# Patient Record
Sex: Male | Born: 1970 | Race: White | Hispanic: No | Marital: Married | Smoking: Former smoker
Health system: Southern US, Community
[De-identification: ages and names within clinical notes are randomized; demographics above are authoritative.]

## PROBLEM LIST (undated history)

## (undated) DIAGNOSIS — N289 Disorder of kidney and ureter, unspecified: Secondary | ICD-10-CM

## (undated) HISTORY — PX: HERNIA REPAIR: SHX51

---

## 2006-02-13 ENCOUNTER — Emergency Department (HOSPITAL_COMMUNITY): Admission: EM | Admit: 2006-02-13 | Discharge: 2006-02-13 | Payer: Self-pay | Admitting: Emergency Medicine

## 2006-02-15 ENCOUNTER — Ambulatory Visit (HOSPITAL_COMMUNITY): Admission: AD | Admit: 2006-02-15 | Discharge: 2006-02-15 | Payer: Self-pay | Admitting: Urology

## 2007-03-09 ENCOUNTER — Emergency Department (HOSPITAL_COMMUNITY): Admission: EM | Admit: 2007-03-09 | Discharge: 2007-03-09 | Payer: Self-pay | Admitting: Emergency Medicine

## 2007-06-11 ENCOUNTER — Emergency Department (HOSPITAL_COMMUNITY): Admission: EM | Admit: 2007-06-11 | Discharge: 2007-06-11 | Payer: Self-pay | Admitting: Emergency Medicine

## 2010-09-03 NOTE — Op Note (Signed)
NAMEAADIN, GAUT NO.:  [id number]   MEDICAL RECORD NO.:  [id number]          PATIENT TYPE:  AMB   LOCATION:  DAY                          FACILITY:  [facility]   PHYSICIAN:  Maretta Bees. Vonita Moss, M.D.DATE OF BIRTH:  Nov 08, 1970   DATE OF PROCEDURE:  02/15/2006  DATE OF DISCHARGE:                                 OPERATIVE REPORT   PREOPERATIVE DIAGNOSIS:  Distal left ureteral stone.   POSTOPERATIVE DIAGNOSIS:  Distal left ureteral stone.   PROCEDURE:  Cystoscopy, left ureteroscopy and left ureteroscopic stone  basketing.   SURGEON:  Dr. Larey Dresser.   ANESTHESIA:  General.   INDICATIONS:  This 40 year old gentleman is having severe left flank pain,  nausea and discomfort and has missed work for the last 3 days due to pain  from a 3-4 mm stone in the distal left ureter diagnosed on CT scan at [facility] on 02/13/2006 I saw him in the office today.  We discussed  observation versus intervention.  He fell like he needs something done  because of the persistent pain and missing work.   PROCEDURE:  The patient was brought to the operating room, placed in  lithotomy position.  External genitalia were prepped and draped in usual  fashion.  He was cystoscoped and the bladder was unremarkable.  The Sensor  guidewire was placed up the left ureter without difficulty.  Ureteral  orifice was not large enough to accommodate the 6-French rigid scope without  dilation, so I inserted the inner core of a ureteral access dilating sheath  and dilated the intramural ureter.  I then utilized a 6-French ureteroscope  and using the nitinol stone basket, retrieved the stone.  There was one  large fragment I retrieved and there was a couple of other smaller fragments  of stone that broke apart from either the dilating sheath or closing the  basket.  In any case, I then reinserted the cystoscope.  He did have some  edema and swelling, but I saw no residual stone fragments.   With the  guidewire in place, I reinserted the inner core of the dilating access  sheath and injected contrast retrograde and there was no extravasation in  the distal ureter and prompt drainage of contrast into the bladder.  Therefore I did not feel like he needed a double-J catheter and the patient  actually desired not to have one placed.  At this point he is taken to  recovery room in good condition having tolerated the procedure well.  The  stone was given to his wife.      Maretta Bees. Vonita Moss, M.D.  Electronically Signed     LJP/MEDQ  D:  02/15/2006  T:  02/16/2006  Job:  [id number]

## 2011-01-07 LAB — POCT CARDIAC MARKERS
CKMB, poc: <1 — ABNORMAL LOW
CKMB, poc: <1 — ABNORMAL LOW
Myoglobin, poc: 11.9 — ABNORMAL LOW
Myoglobin, poc: 7.5 — ABNORMAL LOW

## 2011-01-25 LAB — URINALYSIS, ROUTINE W REFLEX MICROSCOPIC
Glucose, UA: NEGATIVE
Protein, ur: NEGATIVE
pH: 5.5

## 2011-01-25 LAB — URINE CULTURE
Colony Count: NO GROWTH
Culture: NO GROWTH

## 2011-01-25 LAB — URINE MICROSCOPIC-ADD ON

## 2011-01-25 LAB — BASIC METABOLIC PANEL WITH GFR
BUN: 16
Chloride: 107
Creatinine, Ser: 0.93
Glucose, Bld: 111 — ABNORMAL HIGH
Potassium: 3.8

## 2011-01-25 LAB — BASIC METABOLIC PANEL
CO2: 27
Calcium: 9.3
GFR calc Af Amer: >60
GFR calc non Af Amer: >60
Sodium: 140

## 2013-12-30 ENCOUNTER — Emergency Department (HOSPITAL_COMMUNITY)
Admission: EM | Admit: 2013-12-30 | Discharge: 2013-12-30 | Disposition: A | Discharge status: Home / Self Care | Payer: 59 | Attending: Emergency Medicine | Admitting: Emergency Medicine

## 2013-12-30 ENCOUNTER — Encounter (HOSPITAL_COMMUNITY): Payer: Self-pay | Admitting: Emergency Medicine

## 2013-12-30 ENCOUNTER — Emergency Department (HOSPITAL_COMMUNITY): Payer: 59

## 2013-12-30 DIAGNOSIS — R109 Unspecified abdominal pain: Secondary | ICD-10-CM | POA: Diagnosis present

## 2013-12-30 DIAGNOSIS — F411 Generalized anxiety disorder: Secondary | ICD-10-CM | POA: Diagnosis not present

## 2013-12-30 DIAGNOSIS — Z87891 Personal history of nicotine dependence: Secondary | ICD-10-CM | POA: Insufficient documentation

## 2013-12-30 DIAGNOSIS — N23 Unspecified renal colic: Secondary | ICD-10-CM | POA: Insufficient documentation

## 2013-12-30 HISTORY — DX: Disorder of kidney and ureter, unspecified: N28.9

## 2013-12-30 LAB — URINALYSIS, ROUTINE W REFLEX MICROSCOPIC
Bilirubin Urine: NEGATIVE
Glucose, UA: NEGATIVE mg/dL
Ketones, ur: NEGATIVE mg/dL
LEUKOCYTES UA: NEGATIVE
NITRITE: NEGATIVE
PH: 6 (ref 5.0–8.0)
Protein, ur: NEGATIVE mg/dL
SPECIFIC GRAVITY, URINE: 1.022 (ref 1.005–1.030)
UROBILINOGEN UA: 0.2 mg/dL (ref 0.0–1.0)

## 2013-12-30 LAB — URINE MICROSCOPIC-ADD ON

## 2013-12-30 MED ORDER — OXYCODONE-ACETAMINOPHEN 7.5-325 MG PO TABS
1.0000 | ORAL_TABLET | ORAL | Status: DC | PRN
Start: 2013-12-30 — End: 2014-01-02

## 2013-12-30 MED ORDER — HYDROMORPHONE HCL PF 1 MG/ML IJ SOLN
1.0000 mg | Freq: Once | INTRAMUSCULAR | Status: AC
  Administered 2013-12-30: 1 mg via INTRAVENOUS
  Filled 2013-12-30: qty 1

## 2013-12-30 MED ORDER — HYDROMORPHONE HCL PF 1 MG/ML IJ SOLN
2.0000 mg | Freq: Once | INTRAMUSCULAR | Status: AC
  Administered 2013-12-30: 2 mg via INTRAVENOUS
  Filled 2013-12-30: qty 2

## 2013-12-30 MED ORDER — KETOROLAC TROMETHAMINE 30 MG/ML IJ SOLN
30.0000 mg | Freq: Once | INTRAMUSCULAR | Status: AC
  Administered 2013-12-30: 30 mg via INTRAVENOUS
  Filled 2013-12-30: qty 1

## 2013-12-30 MED ORDER — TAMSULOSIN HCL 0.4 MG PO CAPS: 0.4000 mg | ORAL_CAPSULE | Freq: Every day | ORAL | Status: DC

## 2013-12-30 MED ORDER — ONDANSETRON 8 MG PO TBDP: 8.0000 mg | ORAL_TABLET | Freq: Three times a day (TID) | ORAL | Status: DC | PRN

## 2013-12-30 MED ORDER — SODIUM CHLORIDE 0.9 % IV SOLN
INTRAVENOUS | Status: DC
  Administered 2013-12-30: 11:00:00 via INTRAVENOUS

## 2013-12-30 MED ORDER — ONDANSETRON HCL 4 MG/2ML IJ SOLN
4.0000 mg | Freq: Once | INTRAMUSCULAR | Status: AC
  Administered 2013-12-30: 4 mg via INTRAVENOUS
  Filled 2013-12-30: qty 2

## 2013-12-30 NOTE — ED Notes (Signed)
Pt c/o sudden L side flank pain and nausea starting this morning.  Pain score 10/10.  Hx of kidney stones.  Denies GU issues.

## 2013-12-30 NOTE — ED Provider Notes (Signed)
CSN: [id number]     Arrival date & time 12/30/13  1041 History   First MD Initiated Contact with Patient 12/30/13 1100     Chief Complaint  Patient presents with  . Flank Pain     (Consider location/radiation/quality/duration/timing/severity/associated sxs/prior Treatment) HPI Comments: Patient here with acute onset of left-sided flank pain similar to his kidney stones in the past. Denies any dysuria or hematuria. Pain is characterized as sharp and hard to find a spot that is comfortable. Denies any testicular pain at this time. Some nausea with associated emesis. No diarrhea noted. No fever noted. Symptoms persistent and nothing makes them better worse. No treatment used prior to arrival.  Patient is a 43 y.o. male presenting with flank pain. The history is provided by the patient.  Flank Pain    Past Medical History  Diagnosis Date  . Renal disorder    Past Surgical History  Procedure Laterality Date  . Hernia repair Bilateral    History reviewed. No pertinent family history. History  Substance Use Topics  . Smoking status: Former Games developer  . Smokeless tobacco: Not on file  . Alcohol Use: Yes    Review of Systems  Genitourinary: Positive for flank pain.  All other systems reviewed and are negative.     Allergies  Review of patient's allergies indicates no known allergies.  Home Medications   Prior to Admission medications   Not on File   BP 137/90  Pulse 96  Temp(Src) 98.2 F (36.8 C) (Oral)  Resp 15  SpO2 96% Physical Exam  Nursing note and vitals reviewed. Constitutional: He is oriented to person, place, and time. He appears well-developed and well-nourished.  Non-toxic appearance. No distress.  HENT:  Head: Normocephalic and atraumatic.  Eyes: Conjunctivae, EOM and lids are normal. Pupils are equal, round, and reactive to light.  Neck: Normal range of motion. Neck supple. No tracheal deviation present. No mass present.  Cardiovascular: Normal rate,  regular rhythm and normal heart sounds.  Exam reveals no gallop.   No murmur heard. Pulmonary/Chest: Effort normal and breath sounds normal. No stridor. No respiratory distress. He has no decreased breath sounds. He has no wheezes. He has no rhonchi. He has no rales.  Abdominal: Soft. Normal appearance and bowel sounds are normal. He exhibits no distension. There is no tenderness. There is no rigidity, no rebound, no guarding and no CVA tenderness.  Musculoskeletal: Normal range of motion. He exhibits no edema and no tenderness.  Neurological: He is alert and oriented to person, place, and time. He has normal strength. No cranial nerve deficit or sensory deficit. GCS eye subscore is 4. GCS verbal subscore is 5. GCS motor subscore is 6.  Skin: Skin is warm and dry. No abrasion and no rash noted.  Psychiatric: His speech is normal and behavior is normal. His mood appears anxious.    ED Course  Procedures (including critical care time) Labs Review Labs Reviewed  URINE CULTURE  URINALYSIS, ROUTINE W REFLEX MICROSCOPIC    Imaging Review No results found.   EKG Interpretation None      MDM   Final diagnoses:  None    Patient given pain meds and feels better. Will be given referral to urology   Toy Baker, MD 12/30/13 [phone]

## 2013-12-30 NOTE — Discharge Instructions (Signed)

## 2013-12-31 ENCOUNTER — Other Ambulatory Visit: Payer: Self-pay | Admitting: Urology

## 2013-12-31 LAB — URINE CULTURE
COLONY COUNT: NO GROWTH
Culture: NO GROWTH

## 2014-01-01 ENCOUNTER — Encounter (HOSPITAL_COMMUNITY): Payer: Self-pay | Admitting: *Deleted

## 2014-01-01 MED ORDER — GENTAMICIN SULFATE 40 MG/ML IJ SOLN
360.0000 mg | INTRAVENOUS | Status: DC
  Filled 2014-01-01: qty 9

## 2014-01-02 ENCOUNTER — Ambulatory Visit (HOSPITAL_COMMUNITY)
Admission: RE | Admit: 2014-01-02 | Discharge: 2014-01-02 | Disposition: A | Discharge status: Home / Self Care | Payer: 59 | Source: Ambulatory Visit | Attending: Urology | Admitting: Urology

## 2014-01-02 ENCOUNTER — Encounter (HOSPITAL_COMMUNITY)
Admission: RE | Disposition: A | Discharge status: Home / Self Care | Payer: Self-pay | Source: Ambulatory Visit | Attending: Urology

## 2014-01-02 ENCOUNTER — Ambulatory Visit (HOSPITAL_COMMUNITY): Payer: 59

## 2014-01-02 ENCOUNTER — Encounter (HOSPITAL_COMMUNITY): Payer: Self-pay | Admitting: *Deleted

## 2014-01-02 ENCOUNTER — Encounter (HOSPITAL_COMMUNITY): Payer: Self-pay | Admitting: Pharmacy Technician

## 2014-01-02 DIAGNOSIS — N201 Calculus of ureter: Secondary | ICD-10-CM | POA: Diagnosis present

## 2014-01-02 LAB — CREATININE, SERUM
CREATININE: 0.88 mg/dL (ref 0.50–1.35)
GFR calc Af Amer: >90 mL/min (ref >90)
GFR calc non Af Amer: >90 mL/min (ref >90)

## 2014-01-02 SURGERY — LITHOTRIPSY, ESWL
Anesthesia: LOCAL | Laterality: Left

## 2014-01-02 MED ORDER — DIPHENHYDRAMINE HCL 25 MG PO CAPS
25.0000 mg | ORAL_CAPSULE | ORAL | Status: AC
  Administered 2014-01-02: 25 mg via ORAL
  Filled 2014-01-02: qty 1

## 2014-01-02 MED ORDER — SENNOSIDES-DOCUSATE SODIUM 8.6-50 MG PO TABS: 1.0000 | ORAL_TABLET | Freq: Two times a day (BID) | ORAL | Status: DC

## 2014-01-02 MED ORDER — SODIUM CHLORIDE 0.9 % IV SOLN
INTRAVENOUS | Status: DC
  Administered 2014-01-02: 13:00:00 via INTRAVENOUS

## 2014-01-02 MED ORDER — DIAZEPAM 5 MG PO TABS
10.0000 mg | ORAL_TABLET | ORAL | Status: AC
  Administered 2014-01-02: 10 mg via ORAL
  Filled 2014-01-02: qty 2

## 2014-01-02 MED ORDER — GENTAMICIN SULFATE 40 MG/ML IJ SOLN
5.0000 mg/kg | Freq: Once | INTRAMUSCULAR | Status: AC
  Administered 2014-01-02: 360 mg via INTRAVENOUS
  Filled 2014-01-02: qty 9

## 2014-01-02 MED ORDER — OXYCODONE-ACETAMINOPHEN 7.5-325 MG PO TABS: 1.0000 | ORAL_TABLET | Freq: Four times a day (QID) | ORAL | Status: AC | PRN

## 2014-01-02 NOTE — Discharge Instructions (Signed)
1 - You may have occasional flank pain and bloody urine on / off x1-2 weeks. This is normal.  2 - Call MD or go to ER for fever >102, severe pain / nausea / vomiting not relieved by medications, or acute change in medical status

## 2014-01-02 NOTE — H&P (Signed)
Ernest Lozano is an 43 y.o. male.    Chief Complaint: Pre-OP Left Shockwave Lithotripsy  HPI:   1 - Recurrent Nephrolithiasis -  Pre 2015 - URS x1, MET x2 12/2013 - ER CT with 81mm left proximal ureteral stone, 500HU, SSC 10cm at L3-L4 interspace on scout imagies and 20mm left lower pole stone. No right sided.   2 - Medical Stone Disease / Oliguria / Hypercalciuria -  Eval 2007: Composition - CaPO4 60%/ CaOx 40%; 24 Hr urines - low voluem (1.3L), elevated Ca (360). --> K cit x 2 years, but then lost to follow pu Eval 2015: BMP,PTH,Urate -normal; Composition - pending; 24 Hr Urines - pending.   PMH sig for bilateral inguinal hernia repair.   Today Ernest Lozano is seen to proceed with left shockwave lithotripsy. NO interval fevers.   Past Medical History  Diagnosis Date  . Renal disorder     Past Surgical History  Procedure Laterality Date  . Hernia repair Bilateral     History reviewed. No pertinent family history. Social History:  reports that he has quit smoking. He does not have any smokeless tobacco history on file. He reports that he drinks alcohol. He reports that he does not use illicit drugs.  Allergies: No Known Allergies  No prescriptions prior to admission    No results found for this or any previous visit (from the past 48 hour(s)). No results found.  Review of Systems  Constitutional: Negative.  Negative for fever.  Eyes: Negative.   Respiratory: Negative.   Cardiovascular: Negative.   Gastrointestinal: Positive for nausea.  Genitourinary: Positive for flank pain.  Musculoskeletal: Negative.   Skin: Negative.   Endo/Heme/Allergies: Negative.   Psychiatric/Behavioral: Negative.     Height $Remov'5\' 7"'TwAoxv$  (1.702 m), weight 71.215 kg (157 lb). Physical Exam  Constitutional: He is oriented to person, place, and time. He appears well-developed and well-nourished.  HENT:  Head: Normocephalic and atraumatic.  Eyes: Pupils are equal, round, and reactive to light.   Neck: Normal range of motion. Neck supple.  Cardiovascular: Normal rate.   Respiratory: Effort normal.  GI: Soft.  Genitourinary:  Moderate left CVAT  Musculoskeletal: Normal range of motion.  Neurological: He is alert and oriented to person, place, and time.  Skin: Skin is warm and dry.  Psychiatric: He has a normal mood and affect. His behavior is normal. Judgment and thought content normal.     Assessment/Plan   1 - Recurrent Nephrolithiasis - presently with left ureteral stone, small, that is obstructing.   We rediscussed shockwave lithotripsy in detail as well as my "rule of 9s" with stones <13mm, less than 900 HU, and skin to stone distance <9cm having approximately 90% treatment success with single session of treatment. We then readdressed how stones that are larger, more dense, and in patients with less favorable anatomy have incrementally decreased success rates. We rediscussed risks including, bleeding, infection, hematoma, loss of kidney, need for staged therapy, need for adjunctive therapy and requirement to refrain from any anticoagulants, anti-platelet or aspirin-like products peri-procedureally. After careful consideration, the patient has chosen to proceed.    2 - Medical Stone Disease / Oliguria / Hypercalciuria -serum evaluation normal, will repeat 24 hr urines when over acute colic episode.     Jhade Berko 01/02/2014, 8:03 AM

## 2014-11-10 ENCOUNTER — Ambulatory Visit
Admission: EM | Admit: 2014-11-10 | Discharge: 2014-11-10 | Disposition: A | Discharge status: Home / Self Care | Payer: 59 | Attending: Internal Medicine | Admitting: Internal Medicine

## 2014-11-10 ENCOUNTER — Encounter: Payer: Self-pay | Admitting: Emergency Medicine

## 2014-11-10 DIAGNOSIS — L247 Irritant contact dermatitis due to plants, except food: Secondary | ICD-10-CM

## 2014-11-10 MED ORDER — PREDNISONE 50 MG PO TABS: 50.0000 mg | ORAL_TABLET | Freq: Every day | ORAL | Status: AC

## 2014-11-10 NOTE — Discharge Instructions (Signed)
Prescription for prednisone sent to [facility] [place]. Anticipate gradual improvement over the next several days.  Contact Dermatitis Contact dermatitis is a reaction to certain substances that touch the skin. Contact dermatitis can be either irritant contact dermatitis or allergic contact dermatitis. Irritant contact dermatitis does not require previous exposure to the substance for a reaction to occur.Allergic contact dermatitis only occurs if you have been exposed to the substance before. Upon a repeat exposure, your body reacts to the substance.  CAUSES  Many substances can cause contact dermatitis. Irritant dermatitis is most commonly caused by repeated exposure to mildly irritating substances, such as:  Makeup.  Soaps.  Detergents.  Bleaches.  Acids.  Metal salts, such as nickel. Allergic contact dermatitis is most commonly caused by exposure to:  Poisonous plants.  Chemicals (deodorants, shampoos).  Jewelry.  Latex.  Neomycin in triple antibiotic cream.  Preservatives in products, including clothing. SYMPTOMS  The area of skin that is exposed may develop:  Dryness or flaking.  Redness.  Cracks.  Itching.  Pain or a burning sensation.  Blisters. With allergic contact dermatitis, there may also be swelling in areas such as the eyelids, mouth, or genitals.  DIAGNOSIS  Your caregiver can usually tell what the problem is by doing a physical exam. In cases where the cause is uncertain and an allergic contact dermatitis is suspected, a patch skin test may be performed to help determine the cause of your dermatitis. TREATMENT Treatment includes protecting the skin from further contact with the irritating substance by avoiding that substance if possible. Barrier creams, powders, and gloves may be helpful. Your caregiver may also recommend:  Steroid creams or ointments applied 2 times daily. For best results, soak the rash area in cool water for 20 minutes. Then apply the  medicine. Cover the area with a plastic wrap. You can store the steroid cream in the refrigerator for a "chilly" effect on your rash. That may decrease itching. Oral steroid medicines may be needed in more severe cases.  Antibiotics or antibacterial ointments if a skin infection is present.  Antihistamine lotion or an antihistamine taken by mouth to ease itching.  Lubricants to keep moisture in your skin.  Burow's solution to reduce redness and soreness or to dry a weeping rash. Mix one packet or tablet of solution in 2 cups cool water. Dip a clean washcloth in the mixture, wring it out a bit, and put it on the affected area. Leave the cloth in place for 30 minutes. Do this as often as possible throughout the day.  Taking several cornstarch or baking soda baths daily if the area is too large to cover with a washcloth. Harsh chemicals, such as alkalis or acids, can cause skin damage that is like a burn. You should flush your skin for 15 to 20 minutes with cold water after such an exposure. You should also seek immediate medical care after exposure. Bandages (dressings), antibiotics, and pain medicine may be needed for severely irritated skin.  HOME CARE INSTRUCTIONS  Avoid the substance that caused your reaction.  Keep the area of skin that is affected away from hot water, soap, sunlight, chemicals, acidic substances, or anything else that would irritate your skin.  Do not scratch the rash. Scratching may cause the rash to become infected.  You may take cool baths to help stop the itching.  Only take over-the-counter or prescription medicines as directed by your caregiver.  See your caregiver for follow-up care as directed to make sure your skin  is healing properly. SEEK MEDICAL CARE IF:   Your condition is not better after 3 days of treatment.  You seem to be getting worse.  You see signs of infection such as swelling, tenderness, redness, soreness, or warmth in the affected  area.  You have any problems related to your medicines. Document Released: 04/01/2000 Document Revised: 06/27/2011 Document Reviewed: 09/07/2010 [facility] Patient Information 2015 [place], [place]. This information is not intended to replace advice given to you by your health care provider. Make sure you discuss any questions you have with your health care provider.

## 2014-11-10 NOTE — ED Provider Notes (Signed)
CSN: [id number]     Arrival date & time 11/10/14  1420 History   First MD Initiated Contact with Patient 11/10/14 1502     Chief Complaint  Patient presents with  . Rash   HPI  Patient is a 44 year old with benign past medical history. He was trimming bushes with his daughter in the last several days, and now has itchy red bumps over the lower legs, with a few more appearing on his arms. No fever, no malaise. Feels fine otherwise.  Past Medical History  Diagnosis Date  . Renal disorder    Past Surgical History  Procedure Laterality Date  . Hernia repair Bilateral    History reviewed. No pertinent family history. History  Substance Use Topics  . Smoking status: Former Games developer  . Smokeless tobacco: Not on file  . Alcohol Use: Yes    Review of Systems  All other systems reviewed and are negative.   Allergies  Review of patient's allergies indicates no known allergies.  Home Medications   Prior to Admission medications   Medication Sig Start Date End Date Taking? Authorizing Provider  Bisacodyl (DULCOLAX PO) Take 1 tablet by mouth once.    Historical Provider, MD  ondansetron (ZOFRAN ODT) 8 MG disintegrating tablet Take 1 tablet (8 mg total) by mouth every 8 (eight) hours as needed for nausea or vomiting. 12/30/13   Lorre Nick, MD  oxyCODONE-acetaminophen (PERCOCET) 7.5-325 MG per tablet Take 1-2 tablets by mouth every 6 (six) hours as needed for pain. After Lithotripsy 01/02/14   Sebastian Ache, MD  Phenazopyridine HCl (AZO TABS PO) Take 1 tablet by mouth once as needed.    Historical Provider, MD         senna-docusate (SENOKOT-S) 8.6-50 MG per tablet Take 1 tablet by mouth 2 (two) times daily. While taking pain meds to prevent constipation 01/02/14   Sebastian Ache, MD  tamsulosin (FLOMAX) 0.4 MG CAPS capsule Take 1 capsule (0.4 mg total) by mouth daily. 12/30/13   Lorre Nick, MD   BP 143/90 mmHg  Pulse 71  Temp(Src) 98.1 F (36.7 C) (Oral)  Resp 18  Ht  (1.702  m)  Wt 168 lb (76.204 kg)  BMI 26.31 kg/m2  SpO2 99% Physical Exam  Constitutional: He is oriented to person, place, and time. No distress.  Alert, nicely groomed  HENT:  Head: Atraumatic.  Eyes:  Conjugate gaze, no eye redness/drainage  Neck: Neck supple.  Cardiovascular: Normal rate.   Pulmonary/Chest: No respiratory distress.  Abdominal: Soft. He exhibits no distension.  Musculoskeletal: Normal range of motion.  Neurological: He is alert and oriented to person, place, and time.  Skin: Skin is warm and dry.  No cyanosis Scattered red papules over both lower extremities below the knees, with a few over both forearms  Nursing note and vitals reviewed.   ED Course  Procedures   MDM   1. Plant irritant contact dermatitis    rx prednisone sent to pharmacy.  Recheck if not improving in a few days.    Eustace Moore, MD 11/10/14 1520

## 2014-11-10 NOTE — ED Notes (Signed)
Pt reports rash since Thursday on legs and arms, concerned for poison ivy. Itchy.

## 2015-05-07 ENCOUNTER — Emergency Department (HOSPITAL_COMMUNITY): Payer: Commercial Managed Care - HMO

## 2015-05-07 ENCOUNTER — Emergency Department (HOSPITAL_COMMUNITY)
Admission: EM | Admit: 2015-05-07 | Discharge: 2015-05-07 | Disposition: A | Discharge status: Home / Self Care | Payer: Commercial Managed Care - HMO | Attending: Emergency Medicine | Admitting: Emergency Medicine

## 2015-05-07 ENCOUNTER — Encounter (HOSPITAL_COMMUNITY): Payer: Self-pay | Admitting: Emergency Medicine

## 2015-05-07 DIAGNOSIS — R05 Cough: Secondary | ICD-10-CM | POA: Diagnosis not present

## 2015-05-07 DIAGNOSIS — Z79899 Other long term (current) drug therapy: Secondary | ICD-10-CM | POA: Diagnosis not present

## 2015-05-07 DIAGNOSIS — Z87448 Personal history of other diseases of urinary system: Secondary | ICD-10-CM | POA: Diagnosis not present

## 2015-05-07 DIAGNOSIS — Z791 Long term (current) use of non-steroidal anti-inflammatories (NSAID): Secondary | ICD-10-CM | POA: Diagnosis not present

## 2015-05-07 DIAGNOSIS — Z7952 Long term (current) use of systemic steroids: Secondary | ICD-10-CM | POA: Insufficient documentation

## 2015-05-07 DIAGNOSIS — R109 Unspecified abdominal pain: Secondary | ICD-10-CM | POA: Diagnosis present

## 2015-05-07 DIAGNOSIS — Z8719 Personal history of other diseases of the digestive system: Secondary | ICD-10-CM | POA: Diagnosis not present

## 2015-05-07 DIAGNOSIS — Z792 Long term (current) use of antibiotics: Secondary | ICD-10-CM | POA: Insufficient documentation

## 2015-05-07 DIAGNOSIS — Z87891 Personal history of nicotine dependence: Secondary | ICD-10-CM | POA: Insufficient documentation

## 2015-05-07 DIAGNOSIS — R1033 Periumbilical pain: Secondary | ICD-10-CM | POA: Insufficient documentation

## 2015-05-07 LAB — URINE MICROSCOPIC-ADD ON

## 2015-05-07 LAB — COMPREHENSIVE METABOLIC PANEL
ALBUMIN: 4.3 g/dL (ref 3.5–5.0)
ALT: 20 U/L (ref 17–63)
AST: 22 U/L (ref 15–41)
Alkaline Phosphatase: 93 U/L (ref 38–126)
Anion gap: 13 (ref 5–15)
BUN: 17 mg/dL (ref 6–20)
CO2: 23 mmol/L (ref 22–32)
Calcium: 9.5 mg/dL (ref 8.9–10.3)
Chloride: 105 mmol/L (ref 101–111)
Creatinine, Ser: 0.88 mg/dL (ref 0.61–1.24)
GFR calc Af Amer: >60 mL/min (ref >60)
GFR calc non Af Amer: >60 mL/min (ref >60)
GLUCOSE: 118 mg/dL — AB (ref 65–99)
POTASSIUM: 4.1 mmol/L (ref 3.5–5.1)
Sodium: 141 mmol/L (ref 135–145)
Total Bilirubin: 1.4 mg/dL — ABNORMAL HIGH (ref 0.3–1.2)
Total Protein: 8.1 g/dL (ref 6.5–8.1)

## 2015-05-07 LAB — CBC
HEMATOCRIT: 49.8 % (ref 39.0–52.0)
Hemoglobin: 17.5 g/dL — ABNORMAL HIGH (ref 13.0–17.0)
MCH: 31.1 pg (ref 26.0–34.0)
MCHC: 35.1 g/dL (ref 30.0–36.0)
MCV: 88.6 fL (ref 78.0–100.0)
Platelets: 289 10*3/uL (ref 150–400)
RBC: 5.62 MIL/uL (ref 4.22–5.81)
RDW: 13.2 % (ref 11.5–15.5)
WBC: 18 10*3/uL — ABNORMAL HIGH (ref 4.0–10.5)

## 2015-05-07 LAB — URINALYSIS, ROUTINE W REFLEX MICROSCOPIC
BILIRUBIN URINE: NEGATIVE
Glucose, UA: NEGATIVE mg/dL
KETONES UR: NEGATIVE mg/dL
Leukocytes, UA: NEGATIVE
Nitrite: NEGATIVE
PH: 5.5 (ref 5.0–8.0)
Protein, ur: NEGATIVE mg/dL
Specific Gravity, Urine: 1.022 (ref 1.005–1.030)

## 2015-05-07 LAB — LIPASE, BLOOD: Lipase: 26 U/L (ref 11–51)

## 2015-05-07 MED ORDER — ONDANSETRON HCL 4 MG/2ML IJ SOLN
4.0000 mg | Freq: Once | INTRAMUSCULAR | Status: AC
  Administered 2015-05-07: 4 mg via INTRAVENOUS
  Filled 2015-05-07: qty 2

## 2015-05-07 MED ORDER — MORPHINE SULFATE (PF) 4 MG/ML IV SOLN
4.0000 mg | Freq: Once | INTRAVENOUS | Status: AC
  Administered 2015-05-07: 4 mg via INTRAVENOUS
  Filled 2015-05-07: qty 1

## 2015-05-07 MED ORDER — IOHEXOL 300 MG/ML  SOLN
100.0000 mL | Freq: Once | INTRAMUSCULAR | Status: AC | PRN
  Administered 2015-05-07: 100 mL via INTRAVENOUS

## 2015-05-07 MED ORDER — SODIUM CHLORIDE 0.9 % IV BOLUS (SEPSIS)
1000.0000 mL | Freq: Once | INTRAVENOUS | Status: AC
  Administered 2015-05-07: 1000 mL via INTRAVENOUS

## 2015-05-07 MED ORDER — IOHEXOL 300 MG/ML  SOLN
50.0000 mL | Freq: Once | INTRAMUSCULAR | Status: DC | PRN
  Administered 2015-05-07: 50 mL via ORAL

## 2015-05-07 NOTE — ED Provider Notes (Signed)
CSN: [id number]     Arrival date & time 05/07/15  0732 History   First MD Initiated Contact with Patient 05/07/15 [phone]     Chief Complaint  Patient presents with  . Abdominal Pain  . Cough     (Consider location/radiation/quality/duration/timing/severity/associated sxs/prior Treatment) HPI  Pt presenting with c/o left sided abdominal pain that began 3 days ago.  He was seen at Urgent care yesterday and advised to come to the ED due to having WBC of 18K.  Pt was diagnosed with colitis- given rx for cipro and miralax.  Pt continues to have significant abdominal pain.  No fever/chills.  No vomiting or change in stools.  No dysuria.  He has not had similar symptoms in the past.  Pain is constant and waxing and waning in nature.  There are no other associated systemic symptoms, there are no other alleviating or modifying factors.   Past Medical History  Diagnosis Date  . Renal disorder    Past Surgical History  Procedure Laterality Date  . Hernia repair Bilateral    History reviewed. No pertinent family history. Social History  Substance Use Topics  . Smoking status: Former Games developer  . Smokeless tobacco: None  . Alcohol Use: Yes    Review of Systems  ROS reviewed and all otherwise negative except for mentioned in HPI    Allergies  Review of patient's allergies indicates no known allergies.  Home Medications   Prior to Admission medications   Medication Sig Start Date End Date Taking? Authorizing Provider  ciprofloxacin (CIPRO) 500 MG tablet Take 500 mg by mouth 2 (two) times daily. 05/06/15  Yes Historical Provider, MD  ibuprofen (ADVIL,MOTRIN) 200 MG tablet Take 800 mg by mouth every 6 (six) hours as needed for moderate pain.    Yes Historical Provider, MD  Multiple Vitamin (MULTIVITAMIN WITH MINERALS) TABS tablet Take 1 tablet by mouth daily.   Yes Historical Provider, MD  polyethylene glycol (MIRALAX / GLYCOLAX) packet Take 17 g by mouth daily as needed for mild constipation.    Yes Historical Provider, MD  oxyCODONE-acetaminophen (PERCOCET) 7.5-325 MG per tablet Take 1-2 tablets by mouth every 6 (six) hours as needed for pain. After Lithotripsy 01/02/14   Sebastian Ache, MD  predniSONE (DELTASONE) 50 MG tablet Take 1 tablet (50 mg total) by mouth daily. 11/10/14   Eustace Moore, MD   BP 113/81 mmHg  Pulse 89  Temp(Src) 98.6 F (37 C) (Oral)  Resp 17  SpO2 98%  Vitals reviewed Physical Exam  Physical Examination: General appearance - alert, well appearing, and in no distress Mental status - alert, oriented to person, place, and time Mouth - mucous membranes moist, pharynx normal without lesions Neck - supple, no significant adenopathy Chest - clear to auscultation, no wheezes, rales or rhonchi, symmetric air entry Heart - normal rate, regular rhythm, normal S1, S2, no murmurs, rubs, clicks or gallops Abdomen - soft, tender to palpation to the left of umblicus, no gaurding or rebound tenderness, nabs, nondistended, no masses or organomegaly Back exam - full range of motion, no tenderness, palpable spasm or pain on motion Neurological - alert, oriented, normal speech Extremities - peripheral pulses normal, no pedal edema, no clubbing or cyanosis Skin - normal coloration and turgor, no rashes  ED Course  Procedures (including critical care time) Labs Review Labs Reviewed  COMPREHENSIVE METABOLIC PANEL - Abnormal; Notable for the following:    Glucose, Bld 118 (*)    Total Bilirubin 1.4 (*)  All other components within normal limits  CBC - Abnormal; Notable for the following:    WBC 18.0 (*)    Hemoglobin 17.5 (*)    All other components within normal limits  URINALYSIS, ROUTINE W REFLEX MICROSCOPIC (NOT AT [facility]) - Abnormal; Notable for the following:    Hgb urine dipstick MODERATE (*)    All other components within normal limits  URINE MICROSCOPIC-ADD ON - Abnormal; Notable for the following:    Squamous Epithelial / LPF 0-5 (*)    Bacteria, UA FEW  (*)    All other components within normal limits  URINE CULTURE  LIPASE, BLOOD    Imaging Review Dg Chest 2 View  05/07/2015  CLINICAL DATA:  Cough. EXAM: CHEST  2 VIEW COMPARISON:  June 11, 2007 FINDINGS: The heart size and mediastinal contours are within normal limits. Both lungs are clear. The visualized skeletal structures are unremarkable. IMPRESSION: No active cardiopulmonary disease. Electronically Signed   By: Gerome Sam III M.D   On: 05/07/2015 11:32   Ct Abdomen Pelvis W Contrast  05/07/2015  CLINICAL DATA:  Left-sided abdominal pain beginning 3 days ago. Elevated white blood cell count. EXAM: CT ABDOMEN AND PELVIS WITH CONTRAST TECHNIQUE: Multidetector CT imaging of the abdomen and pelvis was performed using the standard protocol following bolus administration of intravenous contrast. CONTRAST:  OMNIPAQUE IOHEXOL 300 MG/ML  SOLN COMPARISON:  12/30/2013 FINDINGS: Dependent subsegmental atelectasis is noted in the lung bases. No pleural effusion. Diffusely decreased attenuation of the liver may reflect mild steatosis or be related to contrast timing. No focal liver lesion is identified. There is no biliary dilatation. The gallbladder, spleen, adrenal glands, right kidney, and pancreas are unremarkable. A subcentimeter low-density lesion in the interpolar left kidney likely represents a cyst. There may be mild scarring in the upper pole of the left kidney. There is slight fullness of the left renal collecting system, significantly improved from prior. No urinary tract calculi are identified. Oral contrast is present in multiple nondilated loops of small bowel. There is no evidence of bowel obstruction or inflammation. The appendix is unremarkable. Bladder is unremarkable. Sequelae of prior bilateral inguinal hernia repair are noted. No free fluid or enlarged lymph nodes are identified. Mild disc degeneration is present at L5-S1. IMPRESSION: 1. No definite acute abnormality  identified in the abdomen or pelvis. 2. Slight fullness of the left renal collecting system. No urinary tract calculi identified. Electronically Signed   By: Sebastian Ache M.D.   On: 05/07/2015 10:24   I have personally reviewed and evaluated these images and lab results as part of my medical decision-making.   EKG Interpretation None      MDM   Final diagnoses:  Abdominal pain, unspecified abdominal location    Pt presenting with c/o left sided mid abdominal pain.  Pt was started on cipro yesterday for presumed colitis and sent to the ED for further evaluation.  WBC remains elevated at 18K.  CT abdomen obtained and reassuring.  There is slight fullness in left renal collecting system and blood in urine which could represent small ureteral stone, urine culture also sent.  In case of UTI- pt is already prescribed cipro.  pts pain is improved after morhine. Advised to f/u with PMD in 2 days.  Discharged with strict return precautions.  Pt agreeable with plan.    Jerelyn Scott, MD 05/07/15 [phone]

## 2015-05-07 NOTE — ED Notes (Signed)
UA and CBC done yesterday, patient has results with him

## 2015-05-07 NOTE — ED Notes (Signed)
Left sided abdominal pain since Monday. PCP referred him here after a WBC of 18, diagnosed with colitis. Pt having normal BMs, denies N/V/D. Also complaining of cough since Monday, skin clammy, lungs clear, cough dry.

## 2015-05-07 NOTE — Discharge Instructions (Signed)
Return to the ED with any concerns including vomiting and not able to keep down liquids, difficulty breathing, fever/chills, decreased level of alertness/lethargy, or any other alarming symptoms  You should continue taking the antibiotics that you were prescribed yesterday as well as the ibuprofen as directed.

## 2015-05-08 LAB — URINE CULTURE: Culture: NO GROWTH

## 2016-05-26 IMAGING — CR DG CHEST 2V
2 series · 2 of 2 positions shown · non-contrast
Comparison: June 11, 2007

CLINICAL DATA: Cough.

EXAM:
CHEST  2 VIEW

[w chest pa]
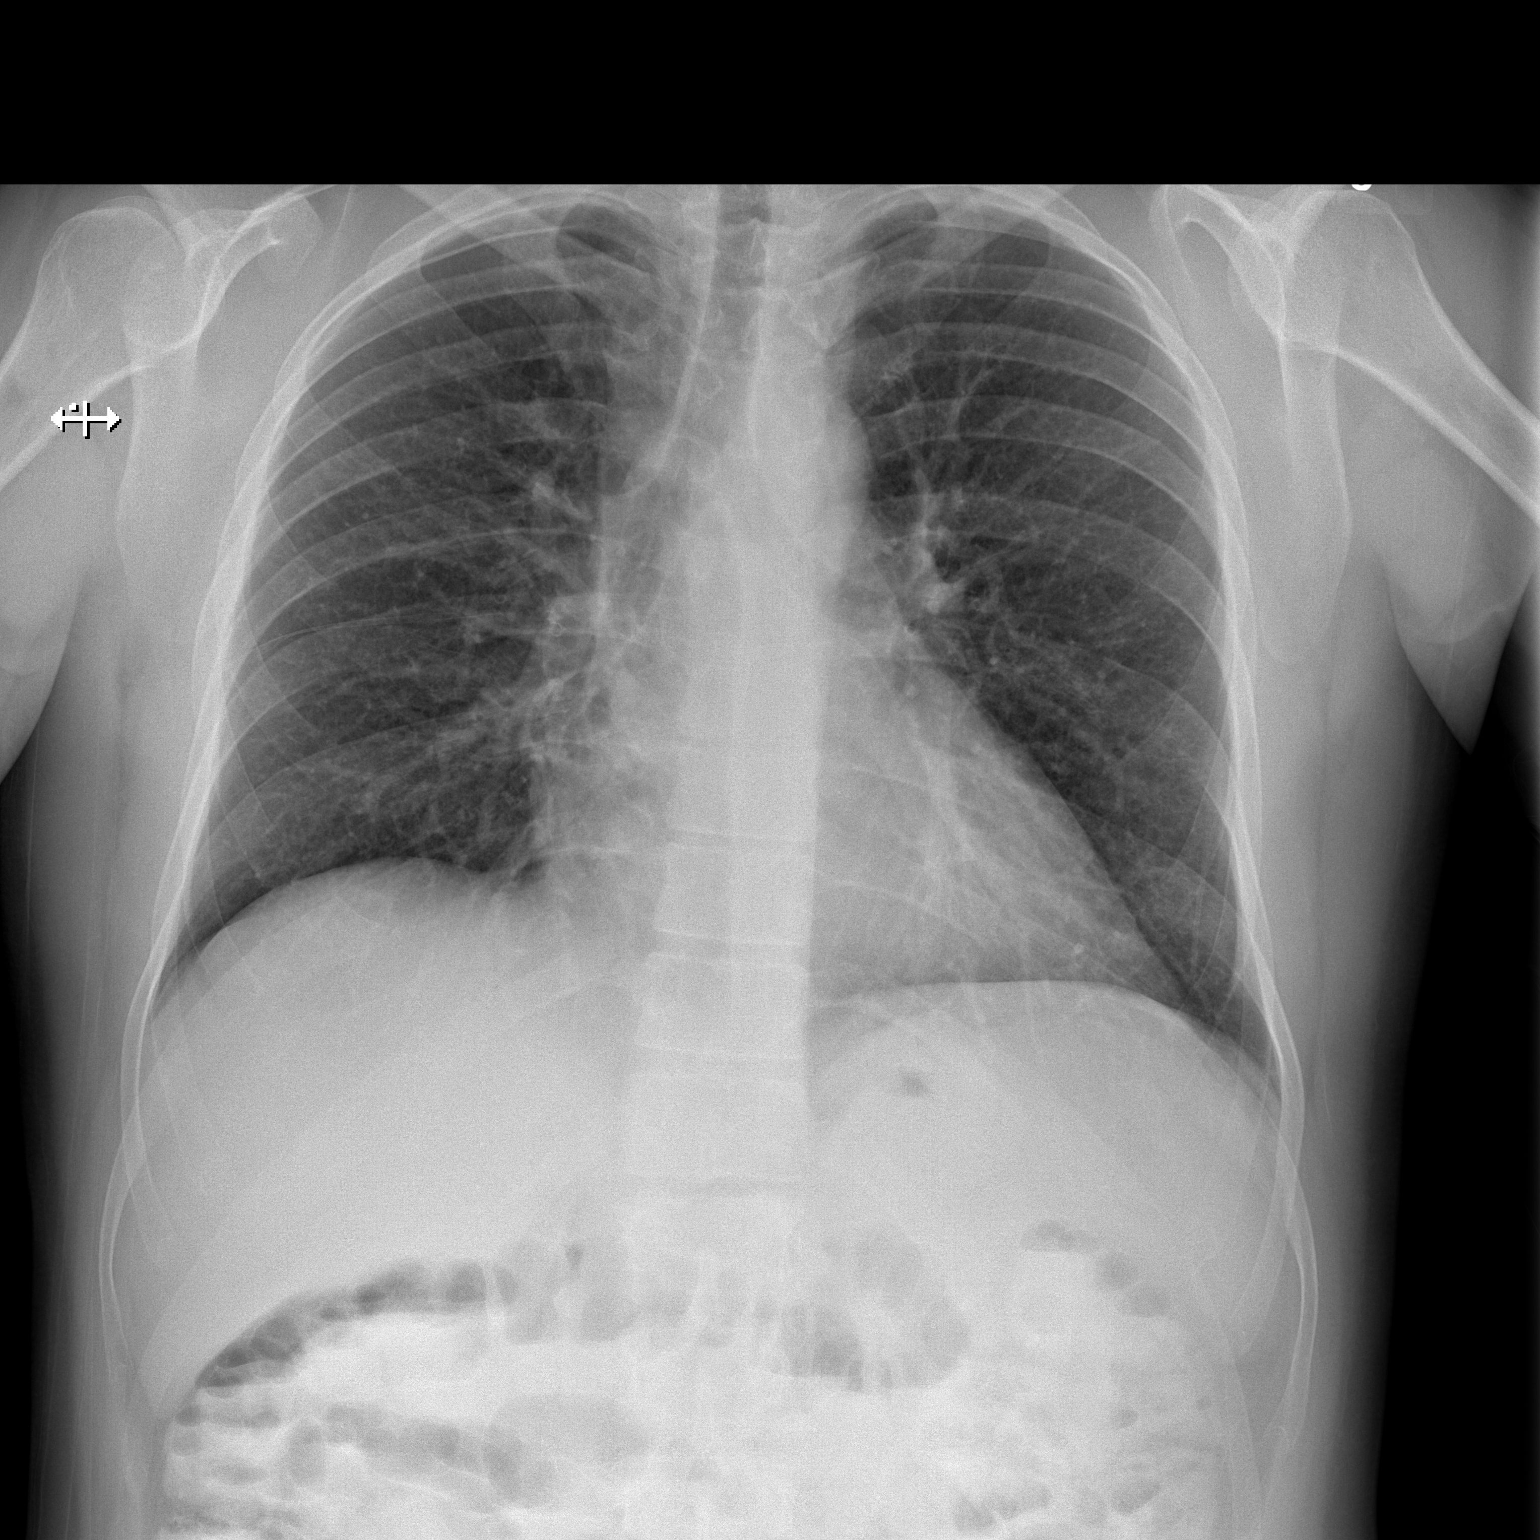

[w chest lat]
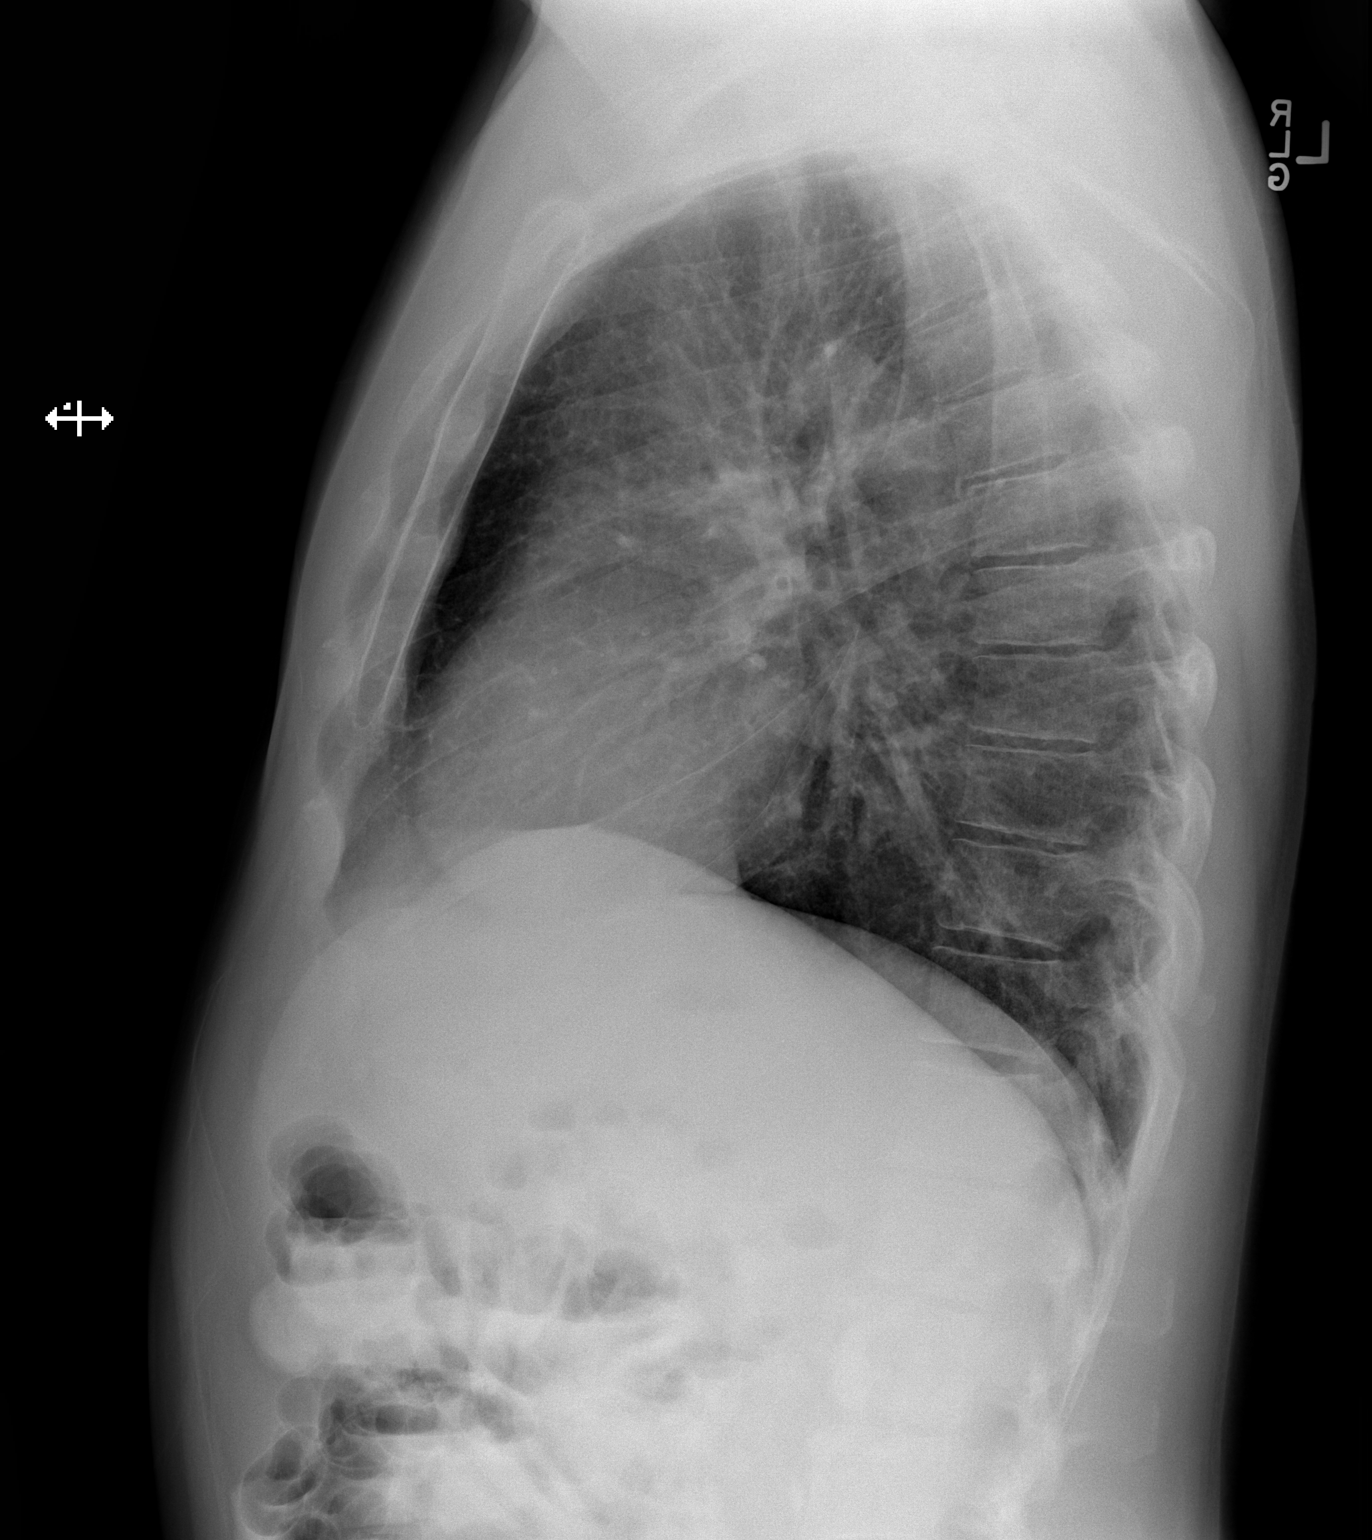

[2 of 2 positions shown; findings below may reference images not displayed]

FINDINGS: The heart size and mediastinal contours are within normal limits.
Both lungs are clear. The visualized skeletal structures are
unremarkable.
IMPRESSION: No active cardiopulmonary disease.
